# Patient Record
Sex: Male | Born: 1995 | Hispanic: Yes | Marital: Single | Smoking: Never smoker
Health system: Southern US, Community
[De-identification: ages and names within clinical notes are randomized; demographics above are authoritative.]

---

## 2020-07-05 ENCOUNTER — Emergency Department: Payer: PRIVATE HEALTH INSURANCE

## 2020-07-05 ENCOUNTER — Other Ambulatory Visit: Payer: Self-pay

## 2020-07-05 ENCOUNTER — Encounter: Payer: Self-pay | Admitting: Emergency Medicine

## 2020-07-05 ENCOUNTER — Emergency Department
Admission: EM | Admit: 2020-07-05 | Discharge: 2020-07-05 | Disposition: A | Discharge status: Home / Self Care | Payer: PRIVATE HEALTH INSURANCE | Attending: Emergency Medicine | Admitting: Emergency Medicine

## 2020-07-05 DIAGNOSIS — S0081XA Abrasion of other part of head, initial encounter: Secondary | ICD-10-CM | POA: Diagnosis not present

## 2020-07-05 DIAGNOSIS — Z23 Encounter for immunization: Secondary | ICD-10-CM | POA: Insufficient documentation

## 2020-07-05 DIAGNOSIS — R519 Headache, unspecified: Secondary | ICD-10-CM | POA: Insufficient documentation

## 2020-07-05 DIAGNOSIS — R21 Rash and other nonspecific skin eruption: Secondary | ICD-10-CM | POA: Diagnosis not present

## 2020-07-05 DIAGNOSIS — S0990XA Unspecified injury of head, initial encounter: Secondary | ICD-10-CM

## 2020-07-05 DIAGNOSIS — M79601 Pain in right arm: Secondary | ICD-10-CM | POA: Diagnosis not present

## 2020-07-05 DIAGNOSIS — Y999 Unspecified external cause status: Secondary | ICD-10-CM | POA: Insufficient documentation

## 2020-07-05 DIAGNOSIS — M79662 Pain in left lower leg: Secondary | ICD-10-CM | POA: Insufficient documentation

## 2020-07-05 DIAGNOSIS — Y939 Activity, unspecified: Secondary | ICD-10-CM | POA: Diagnosis not present

## 2020-07-05 DIAGNOSIS — Y929 Unspecified place or not applicable: Secondary | ICD-10-CM | POA: Diagnosis not present

## 2020-07-05 DIAGNOSIS — T148XXA Other injury of unspecified body region, initial encounter: Secondary | ICD-10-CM

## 2020-07-05 LAB — CBC
HCT: 43.6 % (ref 39.0–52.0)
Hemoglobin: 15.1 g/dL (ref 13.0–17.0)
MCH: 29.4 pg (ref 26.0–34.0)
MCHC: 34.6 g/dL (ref 30.0–36.0)
MCV: 85 fL (ref 80.0–100.0)
Platelets: 169 10*3/uL (ref 150–400)
RBC: 5.13 MIL/uL (ref 4.22–5.81)
RDW: 12.7 % (ref 11.5–15.5)
WBC: 6.8 10*3/uL (ref 4.0–10.5)
nRBC: 0 % (ref 0.0–0.2)

## 2020-07-05 LAB — COMPREHENSIVE METABOLIC PANEL
ALT: 18 U/L (ref 0–44)
AST: 29 U/L (ref 15–41)
Albumin: 4.6 g/dL (ref 3.5–5.0)
Alkaline Phosphatase: 83 U/L (ref 38–126)
Anion gap: 8 (ref 5–15)
BUN: 10 mg/dL (ref 6–20)
CO2: 28 mmol/L (ref 22–32)
Calcium: 8.9 mg/dL (ref 8.9–10.3)
Chloride: 105 mmol/L (ref 98–111)
Creatinine, Ser: 0.77 mg/dL (ref 0.61–1.24)
GFR calc Af Amer: >60 mL/min (ref >60)
GFR calc non Af Amer: >60 mL/min (ref >60)
Glucose, Bld: 111 mg/dL — ABNORMAL HIGH (ref 70–99)
Potassium: 3.5 mmol/L (ref 3.5–5.1)
Sodium: 141 mmol/L (ref 135–145)
Total Bilirubin: 1 mg/dL (ref 0.3–1.2)
Total Protein: 8.1 g/dL (ref 6.5–8.1)

## 2020-07-05 LAB — TROPONIN I (HIGH SENSITIVITY): Troponin I (High Sensitivity): 2 ng/L (ref <18)

## 2020-07-05 MED ORDER — TETANUS-DIPHTH-ACELL PERTUSSIS 5-2.5-18.5 LF-MCG/0.5 IM SUSP
0.5000 mL | Freq: Once | INTRAMUSCULAR | Status: AC
  Administered 2020-07-05: 0.5 mL via INTRAMUSCULAR
  Filled 2020-07-05: qty 0.5

## 2020-07-05 MED ORDER — IBUPROFEN 600 MG PO TABS: 600.0000 mg | ORAL_TABLET | Freq: Three times a day (TID) | ORAL | 0 refills | Status: AC | PRN

## 2020-07-05 MED ORDER — MORPHINE SULFATE (PF) 4 MG/ML IV SOLN
4.0000 mg | Freq: Once | INTRAVENOUS | Status: AC
  Administered 2020-07-05: 4 mg via INTRAVENOUS
  Filled 2020-07-05: qty 1

## 2020-07-05 MED ORDER — CYCLOBENZAPRINE HCL 10 MG PO TABS: 10.0000 mg | ORAL_TABLET | Freq: Three times a day (TID) | ORAL | 0 refills | Status: AC | PRN
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
## 2020-07-05 NOTE — ED Notes (Addendum)
EDP Williams at bedside. States pt will need interpreter. EDP Williams looking for interpreting system now.

## 2020-07-05 NOTE — ED Notes (Signed)
Hospital interpreter at bedside now with EDP Mayford Knife and this RN.
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
## 2020-07-05 NOTE — ED Notes (Signed)
NT to bedside to assist pt.  

## 2020-07-05 NOTE — ED Notes (Signed)
Visitor being screened in lobby to come back to bedside.
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
